# Patient Record
Sex: Female | Born: 1950 | Race: White | Hispanic: No | Smoking: Current every day smoker
Health system: Southern US, Community
[De-identification: ages and names within clinical notes are randomized; demographics above are authoritative.]

## PROBLEM LIST (undated history)

## (undated) DIAGNOSIS — R519 Headache, unspecified: Secondary | ICD-10-CM

## (undated) DIAGNOSIS — H269 Unspecified cataract: Secondary | ICD-10-CM

## (undated) DIAGNOSIS — M797 Fibromyalgia: Secondary | ICD-10-CM

## (undated) DIAGNOSIS — I10 Essential (primary) hypertension: Secondary | ICD-10-CM

## (undated) DIAGNOSIS — R51 Headache: Secondary | ICD-10-CM

## (undated) DIAGNOSIS — F329 Major depressive disorder, single episode, unspecified: Secondary | ICD-10-CM

## (undated) DIAGNOSIS — M4722 Other spondylosis with radiculopathy, cervical region: Secondary | ICD-10-CM

## (undated) DIAGNOSIS — F32A Depression, unspecified: Secondary | ICD-10-CM

## (undated) DIAGNOSIS — M4800 Spinal stenosis, site unspecified: Secondary | ICD-10-CM

## (undated) HISTORY — PX: APPENDECTOMY: SHX54

## (undated) HISTORY — PX: OTHER SURGICAL HISTORY: SHX169

## (undated) HISTORY — PX: EYE SURGERY: SHX253

---

## 1998-02-17 ENCOUNTER — Emergency Department (HOSPITAL_COMMUNITY): Admission: EM | Admit: 1998-02-17 | Discharge: 1998-02-17 | Payer: Self-pay | Admitting: Emergency Medicine

## 1999-07-11 ENCOUNTER — Other Ambulatory Visit: Admission: RE | Admit: 1999-07-11 | Discharge: 1999-07-11 | Payer: Self-pay | Admitting: Family Medicine

## 1999-12-01 ENCOUNTER — Emergency Department (HOSPITAL_COMMUNITY): Admission: EM | Admit: 1999-12-01 | Discharge: 1999-12-01 | Payer: Self-pay | Admitting: Emergency Medicine

## 2000-11-09 ENCOUNTER — Other Ambulatory Visit: Admission: RE | Admit: 2000-11-09 | Discharge: 2000-11-09 | Payer: Self-pay | Admitting: Obstetrics & Gynecology

## 2001-12-17 ENCOUNTER — Other Ambulatory Visit: Admission: RE | Admit: 2001-12-17 | Discharge: 2001-12-17 | Payer: Self-pay | Admitting: Obstetrics & Gynecology

## 2003-02-10 ENCOUNTER — Other Ambulatory Visit: Admission: RE | Admit: 2003-02-10 | Discharge: 2003-02-10 | Payer: Self-pay | Admitting: Family Medicine

## 2004-02-14 ENCOUNTER — Other Ambulatory Visit: Admission: RE | Admit: 2004-02-14 | Discharge: 2004-02-14 | Payer: Self-pay | Admitting: Obstetrics & Gynecology

## 2005-02-27 ENCOUNTER — Other Ambulatory Visit: Admission: RE | Admit: 2005-02-27 | Discharge: 2005-02-27 | Payer: Self-pay | Admitting: Obstetrics & Gynecology

## 2007-06-18 ENCOUNTER — Encounter: Admission: RE | Admit: 2007-06-18 | Discharge: 2007-06-18 | Payer: Self-pay | Admitting: Obstetrics & Gynecology

## 2013-01-07 ENCOUNTER — Other Ambulatory Visit: Payer: Self-pay | Admitting: Neurosurgery

## 2013-01-07 DIAGNOSIS — G959 Disease of spinal cord, unspecified: Secondary | ICD-10-CM

## 2013-01-08 ENCOUNTER — Ambulatory Visit
Admission: RE | Admit: 2013-01-08 | Discharge: 2013-01-08 | Disposition: A | Discharge status: Home / Self Care | Payer: Medicaid Other | Source: Ambulatory Visit | Attending: Neurosurgery | Admitting: Neurosurgery

## 2013-01-08 DIAGNOSIS — G959 Disease of spinal cord, unspecified: Secondary | ICD-10-CM

## 2014-09-05 ENCOUNTER — Other Ambulatory Visit: Payer: Self-pay | Admitting: Family Medicine

## 2014-09-05 DIAGNOSIS — Z720 Tobacco use: Secondary | ICD-10-CM

## 2014-09-26 ENCOUNTER — Other Ambulatory Visit: Payer: Self-pay | Admitting: Family Medicine

## 2014-09-26 DIAGNOSIS — Z720 Tobacco use: Secondary | ICD-10-CM

## 2014-09-27 ENCOUNTER — Inpatient Hospital Stay: Admission: RE | Admit: 2014-09-27 | Payer: Medicaid Other | Source: Ambulatory Visit

## 2014-10-04 ENCOUNTER — Inpatient Hospital Stay: Admission: RE | Admit: 2014-10-04 | Payer: Medicaid Other | Source: Ambulatory Visit

## 2016-02-21 ENCOUNTER — Other Ambulatory Visit: Payer: Self-pay | Admitting: Neurosurgery

## 2016-02-21 DIAGNOSIS — R29898 Other symptoms and signs involving the musculoskeletal system: Secondary | ICD-10-CM

## 2016-02-28 ENCOUNTER — Ambulatory Visit
Admission: RE | Admit: 2016-02-28 | Discharge: 2016-02-28 | Disposition: A | Discharge status: Home / Self Care | Payer: Medicare Other | Source: Ambulatory Visit | Attending: Neurosurgery | Admitting: Neurosurgery

## 2016-02-28 DIAGNOSIS — R29898 Other symptoms and signs involving the musculoskeletal system: Secondary | ICD-10-CM

## 2017-01-15 ENCOUNTER — Other Ambulatory Visit: Payer: Self-pay | Admitting: Neurosurgery

## 2017-01-29 ENCOUNTER — Other Ambulatory Visit: Payer: Self-pay

## 2017-01-29 ENCOUNTER — Encounter (HOSPITAL_COMMUNITY): Payer: Self-pay | Admitting: *Deleted

## 2017-01-29 ENCOUNTER — Encounter (HOSPITAL_COMMUNITY)
Admission: RE | Admit: 2017-01-29 | Discharge: 2017-01-29 | Disposition: A | Discharge status: Home / Self Care | Payer: Medicare Other | Source: Ambulatory Visit | Attending: Neurosurgery | Admitting: Neurosurgery

## 2017-01-29 ENCOUNTER — Ambulatory Visit (HOSPITAL_COMMUNITY)
Admission: RE | Admit: 2017-01-29 | Discharge: 2017-01-29 | Disposition: A | Discharge status: Home / Self Care | Payer: Medicare Other | Source: Ambulatory Visit | Attending: Anesthesiology | Admitting: Anesthesiology

## 2017-01-29 DIAGNOSIS — I700 Atherosclerosis of aorta: Secondary | ICD-10-CM | POA: Diagnosis not present

## 2017-01-29 DIAGNOSIS — Z01811 Encounter for preprocedural respiratory examination: Secondary | ICD-10-CM | POA: Diagnosis not present

## 2017-01-29 HISTORY — DX: Depression, unspecified: F32.A

## 2017-01-29 HISTORY — DX: Headache, unspecified: R51.9

## 2017-01-29 HISTORY — DX: Spinal stenosis, site unspecified: M48.00

## 2017-01-29 HISTORY — DX: Essential (primary) hypertension: I10

## 2017-01-29 HISTORY — DX: Fibromyalgia: M79.7

## 2017-01-29 HISTORY — DX: Headache: R51

## 2017-01-29 HISTORY — DX: Major depressive disorder, single episode, unspecified: F32.9

## 2017-01-29 HISTORY — DX: Unspecified cataract: H26.9

## 2017-01-29 LAB — BASIC METABOLIC PANEL
ANION GAP: 10 (ref 5–15)
BUN: 10 mg/dL (ref 6–20)
CHLORIDE: 101 mmol/L (ref 101–111)
CO2: 25 mmol/L (ref 22–32)
Calcium: 9.4 mg/dL (ref 8.9–10.3)
Creatinine, Ser: 0.59 mg/dL (ref 0.44–1.00)
Glucose, Bld: 101 mg/dL — ABNORMAL HIGH (ref 65–99)
POTASSIUM: 4.2 mmol/L (ref 3.5–5.1)
SODIUM: 136 mmol/L (ref 135–145)

## 2017-01-29 LAB — TYPE AND SCREEN
ABO/RH(D): A POS
Antibody Screen: NEGATIVE

## 2017-01-29 LAB — CBC
HCT: 46.6 % — ABNORMAL HIGH (ref 36.0–46.0)
HEMOGLOBIN: 15.8 g/dL — AB (ref 12.0–15.0)
MCH: 31.5 pg (ref 26.0–34.0)
MCHC: 33.9 g/dL (ref 30.0–36.0)
MCV: 93 fL (ref 78.0–100.0)
PLATELETS: 298 10*3/uL (ref 150–400)
RBC: 5.01 MIL/uL (ref 3.87–5.11)
RDW: 15.3 % (ref 11.5–15.5)
WBC: 9 10*3/uL (ref 4.0–10.5)

## 2017-01-29 LAB — HEMOGLOBIN A1C
HEMOGLOBIN A1C: 6.3 % — AB (ref 4.8–5.6)
MEAN PLASMA GLUCOSE: 134.11 mg/dL

## 2017-01-29 LAB — SURGICAL PCR SCREEN
MRSA, PCR: NEGATIVE
Staphylococcus aureus: NEGATIVE

## 2017-01-29 LAB — ABO/RH: ABO/RH(D): A POS

## 2017-01-29 NOTE — Progress Notes (Signed)
Denies any cardiac issues, no murmur, cp, sob PCP is Dr. Hart CarwinW Elkins  LOV 12/2016 She did say, later in the interview, that it was mentioned that she 'was pre-diabetic' yrs ago.  Has since lost weight.  Takes no meds. 'behaving myself" I did instruct patient to lay off the marijuana for 2-3 days prior to surgery.  She states it does help with her fibro & pain.

## 2017-01-29 NOTE — Pre-Procedure Instructions (Signed)
Sherri Stewart  01/29/2017      [facility] - [place], [place] - [address] [place] [id number] Phone: [phone] Fax: [phone]    Your procedure is scheduled on Wednesday, October 24th   [facility] at 11:00 AM            (posted surgery time 12:55p - 4:41p)   Call this number if you have problems the MORNING of surgery:  [phone].   Remember:              4-5 days prior to surgery, STOP TAKING any Vitamins, Herbal Supplements, Anti-inflammatories, Blood thinners.   Do not eat food or drink liquids after midnight Tuesday.   Take these medicines the morning of surgery with A SIP OF WATER : Xanax, Cymbalta   Do not wear jewelry, make-up or nail polish.  Do not wear lotions, powders, perfumes, or deoderant.  Do not shave 48 hours prior to surgery.     Do not bring valuables to the hospital.  [facility] is not responsible for any belongings or valuables.  Contacts, dentures or bridgework may not be worn into surgery.  Leave your suitcase in the car.  After surgery it may be brought to your room.  For patients admitted to the hospital, discharge time will be determined by your treatment team.  Please read over the following fact sheets that you were given. Pain Booklet, MRSA Information and Surgical Site Infection Prevention      [place]- Preparing For Surgery  Before surgery, you can play an important role. Because skin is not sterile, your skin needs to be as free of germs as possible. You can reduce the number of germs on your skin by washing with CHG (chlorahexidine gluconate) Soap before surgery.  CHG is an antiseptic cleaner which kills germs and bonds with the skin to continue killing germs even after washing.  Please do not use if you have an allergy to CHG or antibacterial soaps. If your skin becomes reddened/irritated stop using the CHG.  Do not shave (including  legs and underarms) for at least 48 hours prior to first CHG shower. It is OK to shave your face.  Please follow these instructions carefully.   1. Shower the NIGHT BEFORE SURGERY and the MORNING OF SURGERY with CHG.   2. If you chose to wash your hair, wash your hair first as usual with your normal shampoo.  3. After you shampoo, rinse your hair and body thoroughly to remove the shampoo.  4. Use CHG as you would any other liquid soap. You can apply CHG directly to the skin and wash gently with a scrungie or a clean washcloth.   5. Apply the CHG Soap to your body ONLY FROM THE NECK DOWN.  Do not use on open wounds or open sores. Avoid contact with your eyes, ears, mouth and genitals (private parts). Wash Face and genitals (private parts)  with your normal soap.  6. Wash thoroughly, paying special attention to the area where your surgery will be performed.  7. Thoroughly rinse your body with warm water from the neck down.  8. DO NOT shower/wash with your normal soap after using and rinsing off the CHG Soap.  9. Pat yourself dry with a CLEAN TOWEL.  10. Wear CLEAN PAJAMAS to bed the night before surgery, wear comfortable clothes the morning of surgery  11. Place CLEAN SHEETS on your bed the  night of your first shower and DO NOT SLEEP WITH PETS.    Day of Surgery: Do not apply any deodorants/lotions. Please wear clean clothes to the hospital/surgery center.

## 2017-01-30 NOTE — Progress Notes (Addendum)
Anesthesia Chart Review: Patient's is a 66 year old female scheduled for C4-5, C5-6, C6-7 ACDF on 02/04/17 by Dr. Tressie StalkerJeffrey Jenkins.  History includes smoking, HTN, depression, fibromylagia, appendectomy. Reportedly she was "pre-diabetic" years ago, but lost weight since. BMI is consistent with obesity.   PCP is listed as Dr. Kaleen MaskWilson Oliver Elkins, reported last office visit in September 2018.    Meds include Xanax, vitamin B12, Cymbalta, HCTZ, lisinopril, prednisone 20 mg every other day. Occasional marijuana (she uses for fibromyalgia pain).  BP 123/71   Pulse 83   Temp 36.7 C   Resp 20   Ht 5' 0.5" (1.537 m)   Wt 198 lb 1.6 oz (89.9 kg)   SpO2 97%   BMI 38.05 kg/m   EKG 01/29/17: NSR, low voltage QRS, inferior infarct (age undetermined). Currently no comparison tracing available. She denied history of cardiac issues, murmur, chest pain, and SOB.   CXR 01/29/17: IMPRESSION: No active cardiopulmonary disease.  Aortic atherosclerosis.  Preoperative labs noted. Cr 0.59, glucose 101. H/H 15.8/46.6. PLT 298K. A1c 6.3%. T&S done.   I have requested last office not and comparison EKG tracing, if available, from Dr. Hennie DuosElkin's office. Chart will be left for follow-up.  Velna Ochsllison Jonluke Cobbins, PA-C [facility]/Anesthesiology Phone [phone] 01/30/2017 3:04 PM  Addendum: Still no records received from Dr. Hennie DuosElkin's office. I called and was told they do not have an EKG on file there. At PAT, patient denied any cardiac issues, murmur history, chest pain and SOB. Reviewed above with anesthesiologist. If no acute changes and remains asymptomatic from a CV standpoint then it is anticipated that she can proceed as planned.  Velna Ochsllison Kalen Neidert, PA-C [facility]Anesthesiology Phone [phone] 02/03/2017 11:40 AM

## 2017-02-04 ENCOUNTER — Encounter (HOSPITAL_COMMUNITY): Payer: Self-pay

## 2017-02-04 ENCOUNTER — Inpatient Hospital Stay (HOSPITAL_COMMUNITY): Payer: Medicare Other | Admitting: Anesthesiology

## 2017-02-04 ENCOUNTER — Inpatient Hospital Stay (HOSPITAL_COMMUNITY): Payer: Medicare Other

## 2017-02-04 ENCOUNTER — Inpatient Hospital Stay (HOSPITAL_COMMUNITY)
Admission: RE | Disposition: A | Discharge status: Home / Self Care | Payer: Self-pay | Source: Ambulatory Visit | Attending: Neurosurgery

## 2017-02-04 ENCOUNTER — Inpatient Hospital Stay (HOSPITAL_COMMUNITY)
Admission: RE | Admit: 2017-02-04 | Discharge: 2017-02-05 | DRG: 473 | Disposition: A | Discharge status: Home / Self Care | Payer: Medicare Other | Source: Ambulatory Visit | Attending: Neurosurgery | Admitting: Neurosurgery

## 2017-02-04 ENCOUNTER — Inpatient Hospital Stay (HOSPITAL_COMMUNITY): Payer: Medicare Other | Admitting: Vascular Surgery

## 2017-02-04 DIAGNOSIS — Z885 Allergy status to narcotic agent status: Secondary | ICD-10-CM | POA: Diagnosis not present

## 2017-02-04 DIAGNOSIS — Z79899 Other long term (current) drug therapy: Secondary | ICD-10-CM | POA: Diagnosis not present

## 2017-02-04 DIAGNOSIS — M50123 Cervical disc disorder at C6-C7 level with radiculopathy: Secondary | ICD-10-CM | POA: Diagnosis present

## 2017-02-04 DIAGNOSIS — F329 Major depressive disorder, single episode, unspecified: Secondary | ICD-10-CM | POA: Diagnosis present

## 2017-02-04 DIAGNOSIS — Z419 Encounter for procedure for purposes other than remedying health state, unspecified: Secondary | ICD-10-CM

## 2017-02-04 DIAGNOSIS — F17210 Nicotine dependence, cigarettes, uncomplicated: Secondary | ICD-10-CM | POA: Diagnosis present

## 2017-02-04 DIAGNOSIS — M4722 Other spondylosis with radiculopathy, cervical region: Secondary | ICD-10-CM | POA: Diagnosis present

## 2017-02-04 DIAGNOSIS — I10 Essential (primary) hypertension: Secondary | ICD-10-CM | POA: Diagnosis present

## 2017-02-04 DIAGNOSIS — M4802 Spinal stenosis, cervical region: Secondary | ICD-10-CM | POA: Diagnosis present

## 2017-02-04 HISTORY — DX: Other spondylosis with radiculopathy, cervical region: M47.22

## 2017-02-04 HISTORY — PX: ANTERIOR CERVICAL DECOMP/DISCECTOMY FUSION: SHX1161

## 2017-02-04 SURGERY — ANTERIOR CERVICAL DECOMPRESSION/DISCECTOMY FUSION 3 LEVELS
Anesthesia: General | Site: Neck

## 2017-02-04 MED ORDER — ONDANSETRON HCL 4 MG/2ML IJ SOLN: 4.0000 mg | Freq: Four times a day (QID) | INTRAMUSCULAR | Status: DC | PRN

## 2017-02-04 MED ORDER — HYDROCORTISONE NA SUCCINATE PF 100 MG IJ SOLR: 50.0000 mg | Freq: Four times a day (QID) | INTRAMUSCULAR | Status: DC

## 2017-02-04 MED ORDER — OXYCODONE HCL 5 MG PO TABS
5.0000 mg | ORAL_TABLET | ORAL | Status: DC | PRN
Start: 2017-02-04 — End: 2017-02-05

## 2017-02-04 MED ORDER — DEXAMETHASONE SODIUM PHOSPHATE 4 MG/ML IJ SOLN: 4.0000 mg | Freq: Four times a day (QID) | INTRAMUSCULAR | Status: DC

## 2017-02-04 MED ORDER — LISINOPRIL 20 MG PO TABS
10.0000 mg | ORAL_TABLET | Freq: Every day | ORAL | Status: DC
  Administered 2017-02-04: 10 mg via ORAL
  Filled 2017-02-04: qty 1

## 2017-02-04 MED ORDER — FENTANYL CITRATE (PF) 250 MCG/5ML IJ SOLN
INTRAMUSCULAR | Status: AC
  Filled 2017-02-04: qty 5

## 2017-02-04 MED ORDER — DEXAMETHASONE SODIUM PHOSPHATE 10 MG/ML IJ SOLN
INTRAMUSCULAR | Status: DC | PRN
  Administered 2017-02-04: 10 mg via INTRAVENOUS

## 2017-02-04 MED ORDER — ALUM & MAG HYDROXIDE-SIMETH 200-200-20 MG/5ML PO SUSP
30.0000 mL | Freq: Four times a day (QID) | ORAL | Status: DC | PRN
Start: 2017-02-04 — End: 2017-02-04

## 2017-02-04 MED ORDER — DEXAMETHASONE SODIUM PHOSPHATE 10 MG/ML IJ SOLN
INTRAMUSCULAR | Status: AC
  Filled 2017-02-04: qty 1

## 2017-02-04 MED ORDER — CHLORHEXIDINE GLUCONATE CLOTH 2 % EX PADS: 6.0000 | MEDICATED_PAD | Freq: Once | CUTANEOUS | Status: DC

## 2017-02-04 MED ORDER — PROPOFOL 10 MG/ML IV BOLUS
INTRAVENOUS | Status: AC
  Filled 2017-02-04: qty 40

## 2017-02-04 MED ORDER — OXYCODONE HCL 5 MG PO TABS: 5.0000 mg | ORAL_TABLET | Freq: Once | ORAL | Status: DC | PRN

## 2017-02-04 MED ORDER — ACETAMINOPHEN 650 MG RE SUPP: 650.0000 mg | RECTAL | Status: DC | PRN

## 2017-02-04 MED ORDER — HEMOSTATIC AGENTS (NO CHARGE) OPTIME
TOPICAL | Status: DC | PRN
  Administered 2017-02-04: 1 via TOPICAL

## 2017-02-04 MED ORDER — OXYCODONE HCL 5 MG PO TABS: 5.0000 mg | ORAL_TABLET | ORAL | Status: DC | PRN

## 2017-02-04 MED ORDER — LIDOCAINE 2% (20 MG/ML) 5 ML SYRINGE
INTRAMUSCULAR | Status: AC
  Filled 2017-02-04: qty 5

## 2017-02-04 MED ORDER — LACTATED RINGERS IV SOLN
INTRAVENOUS | Status: DC
Start: 2017-02-04 — End: 2017-02-05

## 2017-02-04 MED ORDER — BUPIVACAINE-EPINEPHRINE (PF) 0.5% -1:200000 IJ SOLN
INTRAMUSCULAR | Status: DC | PRN
  Administered 2017-02-04: 10 mL

## 2017-02-04 MED ORDER — ALUM & MAG HYDROXIDE-SIMETH 200-200-20 MG/5ML PO SUSP: 30.0000 mL | Freq: Four times a day (QID) | ORAL | Status: DC | PRN

## 2017-02-04 MED ORDER — PREDNISONE 20 MG PO TABS
20.0000 mg | ORAL_TABLET | ORAL | Status: DC
Start: 2017-02-05 — End: 2017-02-05

## 2017-02-04 MED ORDER — ONDANSETRON HCL 4 MG/2ML IJ SOLN
4.0000 mg | Freq: Four times a day (QID) | INTRAMUSCULAR | Status: DC | PRN
Start: 2017-02-04 — End: 2017-02-04

## 2017-02-04 MED ORDER — MIDAZOLAM HCL 5 MG/5ML IJ SOLN
INTRAMUSCULAR | Status: DC | PRN
  Administered 2017-02-04 (×2): 1 mg via INTRAVENOUS

## 2017-02-04 MED ORDER — HYDROMORPHONE HCL 1 MG/ML IJ SOLN
INTRAMUSCULAR | Status: AC
  Administered 2017-02-04: 0.5 mg via INTRAVENOUS
  Filled 2017-02-04: qty 1

## 2017-02-04 MED ORDER — THROMBIN (RECOMBINANT) 5000 UNITS EX SOLR
CUTANEOUS | Status: AC
  Filled 2017-02-04: qty 5000

## 2017-02-04 MED ORDER — HYDROCHLOROTHIAZIDE 25 MG PO TABS
25.0000 mg | ORAL_TABLET | Freq: Every day | ORAL | Status: DC
  Administered 2017-02-04: 25 mg via ORAL
  Filled 2017-02-04: qty 1

## 2017-02-04 MED ORDER — LACTATED RINGERS IV SOLN: INTRAVENOUS | Status: DC

## 2017-02-04 MED ORDER — BISACODYL 10 MG RE SUPP: 10.0000 mg | Freq: Every day | RECTAL | Status: DC | PRN

## 2017-02-04 MED ORDER — ONDANSETRON HCL 4 MG PO TABS: 4.0000 mg | ORAL_TABLET | Freq: Four times a day (QID) | ORAL | Status: DC | PRN

## 2017-02-04 MED ORDER — DEXAMETHASONE 4 MG PO TABS: 4.0000 mg | ORAL_TABLET | Freq: Four times a day (QID) | ORAL | Status: DC

## 2017-02-04 MED ORDER — LIDOCAINE HCL (CARDIAC) 20 MG/ML IV SOLN
INTRAVENOUS | Status: DC | PRN
  Administered 2017-02-04: 100 mg via INTRAVENOUS

## 2017-02-04 MED ORDER — ONDANSETRON HCL 4 MG/2ML IJ SOLN
INTRAMUSCULAR | Status: DC | PRN
  Administered 2017-02-04: 4 mg via INTRAVENOUS

## 2017-02-04 MED ORDER — BACITRACIN ZINC 500 UNIT/GM EX OINT
TOPICAL_OINTMENT | CUTANEOUS | Status: AC
  Filled 2017-02-04: qty 28.35

## 2017-02-04 MED ORDER — PROPOFOL 10 MG/ML IV BOLUS
INTRAVENOUS | Status: DC | PRN
  Administered 2017-02-04: 160 mg via INTRAVENOUS
  Administered 2017-02-04: 40 mg via INTRAVENOUS

## 2017-02-04 MED ORDER — HYDROMORPHONE HCL 1 MG/ML IJ SOLN
0.2500 mg | INTRAMUSCULAR | Status: DC | PRN
  Administered 2017-02-04 (×2): 0.5 mg via INTRAVENOUS

## 2017-02-04 MED ORDER — DOCUSATE SODIUM 100 MG PO CAPS: 100.0000 mg | ORAL_CAPSULE | Freq: Two times a day (BID) | ORAL | Status: DC

## 2017-02-04 MED ORDER — OXYCODONE HCL 5 MG PO TABS: 10.0000 mg | ORAL_TABLET | ORAL | Status: DC | PRN

## 2017-02-04 MED ORDER — CEFAZOLIN SODIUM-DEXTROSE 2-4 GM/100ML-% IV SOLN
2.0000 g | INTRAVENOUS | Status: AC
  Administered 2017-02-04: 2 g via INTRAVENOUS
  Filled 2017-02-04: qty 100

## 2017-02-04 MED ORDER — ZOLPIDEM TARTRATE 5 MG PO TABS: 5.0000 mg | ORAL_TABLET | Freq: Every evening | ORAL | Status: DC | PRN

## 2017-02-04 MED ORDER — PHENYLEPHRINE HCL 10 MG/ML IJ SOLN
INTRAMUSCULAR | Status: DC | PRN
  Administered 2017-02-04: 80 ug via INTRAVENOUS
  Administered 2017-02-04 (×2): 40 ug via INTRAVENOUS

## 2017-02-04 MED ORDER — THROMBIN (RECOMBINANT) 20000 UNITS EX SOLR
CUTANEOUS | Status: AC
  Filled 2017-02-04: qty 20000

## 2017-02-04 MED ORDER — ROCURONIUM BROMIDE 10 MG/ML (PF) SYRINGE
PREFILLED_SYRINGE | INTRAVENOUS | Status: AC
  Filled 2017-02-04: qty 5

## 2017-02-04 MED ORDER — PHENOL 1.4 % MT LIQD: 1.0000 | OROMUCOSAL | Status: DC | PRN

## 2017-02-04 MED ORDER — CEFAZOLIN SODIUM-DEXTROSE 2-4 GM/100ML-% IV SOLN
2.0000 g | Freq: Three times a day (TID) | INTRAVENOUS | Status: AC
  Administered 2017-02-04 (×2): 2 g via INTRAVENOUS
  Filled 2017-02-04 (×2): qty 100

## 2017-02-04 MED ORDER — ONDANSETRON HCL 4 MG/2ML IJ SOLN
INTRAMUSCULAR | Status: AC
  Filled 2017-02-04: qty 2

## 2017-02-04 MED ORDER — ACETAMINOPHEN 325 MG PO TABS: 650.0000 mg | ORAL_TABLET | ORAL | Status: DC | PRN

## 2017-02-04 MED ORDER — FENTANYL CITRATE (PF) 100 MCG/2ML IJ SOLN
INTRAMUSCULAR | Status: DC | PRN
  Administered 2017-02-04 (×8): 50 ug via INTRAVENOUS
  Administered 2017-02-04: 100 ug via INTRAVENOUS

## 2017-02-04 MED ORDER — CEFAZOLIN SODIUM-DEXTROSE 2-4 GM/100ML-% IV SOLN: 2.0000 g | Freq: Three times a day (TID) | INTRAVENOUS | Status: DC

## 2017-02-04 MED ORDER — SUCCINYLCHOLINE CHLORIDE 20 MG/ML IJ SOLN
INTRAMUSCULAR | Status: DC | PRN
  Administered 2017-02-04: 120 mg via INTRAVENOUS

## 2017-02-04 MED ORDER — DULOXETINE HCL 30 MG PO CPEP: 120.0000 mg | ORAL_CAPSULE | Freq: Every day | ORAL | Status: DC

## 2017-02-04 MED ORDER — SODIUM CHLORIDE 0.9 % IR SOLN
Status: DC | PRN
  Administered 2017-02-04: 500 mL

## 2017-02-04 MED ORDER — DEXAMETHASONE 4 MG PO TABS
4.0000 mg | ORAL_TABLET | Freq: Four times a day (QID) | ORAL | Status: AC
  Administered 2017-02-04 – 2017-02-05 (×3): 4 mg via ORAL
  Filled 2017-02-04 (×3): qty 1

## 2017-02-04 MED ORDER — MENTHOL 3 MG MT LOZG: 1.0000 | LOZENGE | OROMUCOSAL | Status: DC | PRN

## 2017-02-04 MED ORDER — OXYCODONE HCL 5 MG/5ML PO SOLN: 5.0000 mg | Freq: Once | ORAL | Status: DC | PRN

## 2017-02-04 MED ORDER — THROMBIN (RECOMBINANT) 5000 UNITS EX SOLR
OROMUCOSAL | Status: DC | PRN
  Administered 2017-02-04 (×2): 5 mL via TOPICAL

## 2017-02-04 MED ORDER — DEXAMETHASONE SODIUM PHOSPHATE 4 MG/ML IJ SOLN
4.0000 mg | Freq: Four times a day (QID) | INTRAMUSCULAR | Status: AC
  Administered 2017-02-04: 4 mg via INTRAVENOUS
  Filled 2017-02-04: qty 1

## 2017-02-04 MED ORDER — MORPHINE SULFATE (PF) 4 MG/ML IV SOLN: 4.0000 mg | INTRAVENOUS | Status: DC | PRN

## 2017-02-04 MED ORDER — SUGAMMADEX SODIUM 200 MG/2ML IV SOLN
INTRAVENOUS | Status: DC | PRN
  Administered 2017-02-04: 200 mg via INTRAVENOUS

## 2017-02-04 MED ORDER — LACTATED RINGERS IV SOLN
INTRAVENOUS | Status: DC | PRN
  Administered 2017-02-04 (×2): via INTRAVENOUS

## 2017-02-04 MED ORDER — SUCCINYLCHOLINE CHLORIDE 200 MG/10ML IV SOSY
PREFILLED_SYRINGE | INTRAVENOUS | Status: AC
  Filled 2017-02-04: qty 10

## 2017-02-04 MED ORDER — MEPERIDINE HCL 25 MG/ML IJ SOLN: 6.2500 mg | INTRAMUSCULAR | Status: DC | PRN

## 2017-02-04 MED ORDER — THROMBIN (RECOMBINANT) 20000 UNITS EX SOLR
CUTANEOUS | Status: DC | PRN
  Administered 2017-02-04: 20000 [IU] via TOPICAL

## 2017-02-04 MED ORDER — BACITRACIN ZINC 500 UNIT/GM EX OINT
TOPICAL_OINTMENT | CUTANEOUS | Status: DC | PRN
  Administered 2017-02-04: 1 via TOPICAL

## 2017-02-04 MED ORDER — OXYCODONE HCL 5 MG PO TABS
10.0000 mg | ORAL_TABLET | ORAL | Status: DC | PRN
Start: 2017-02-04 — End: 2017-02-05
  Administered 2017-02-04 – 2017-02-05 (×5): 10 mg via ORAL
  Filled 2017-02-04 (×5): qty 2

## 2017-02-04 MED ORDER — PROMETHAZINE HCL 25 MG/ML IJ SOLN: 6.2500 mg | INTRAMUSCULAR | Status: DC | PRN

## 2017-02-04 MED ORDER — ROCURONIUM BROMIDE 100 MG/10ML IV SOLN
INTRAVENOUS | Status: DC | PRN
  Administered 2017-02-04 (×2): 10 mg via INTRAVENOUS
  Administered 2017-02-04: 50 mg via INTRAVENOUS
  Administered 2017-02-04 (×2): 10 mg via INTRAVENOUS

## 2017-02-04 MED ORDER — BUPIVACAINE HCL (PF) 0.5 % IJ SOLN
INTRAMUSCULAR | Status: AC
  Filled 2017-02-04: qty 30

## 2017-02-04 MED ORDER — MIDAZOLAM HCL 2 MG/2ML IJ SOLN
INTRAMUSCULAR | Status: AC
  Filled 2017-02-04: qty 2

## 2017-02-04 SURGICAL SUPPLY — 68 items
APL SKNCLS STERI-STRIP NONHPOA (GAUZE/BANDAGES/DRESSINGS) ×1
BAG DECANTER FOR FLEXI CONT (MISCELLANEOUS) ×3 IMPLANT
BENZOIN TINCTURE PRP APPL 2/3 (GAUZE/BANDAGES/DRESSINGS) ×4 IMPLANT
BIT DRILL NEURO 2X3.1 SFT TUCH (MISCELLANEOUS) ×1 IMPLANT
BLADE SURG 15 STRL LF DISP TIS (BLADE) ×1 IMPLANT
BLADE SURG 15 STRL SS (BLADE) ×6
BLADE ULTRA TIP 2M (BLADE) ×3 IMPLANT
BUR BARREL STRAIGHT FLUTE 4.0 (BURR) ×3 IMPLANT
BUR MATCHSTICK NEURO 3.0 LAGG (BURR) ×3 IMPLANT
CAGE PEEK VISTAS 11X14X6 (Cage) ×2 IMPLANT
CANISTER SUCT 3000ML PPV (MISCELLANEOUS) ×3 IMPLANT
CARTRIDGE OIL MAESTRO DRILL (MISCELLANEOUS) ×1 IMPLANT
CLOSURE WOUND 1/2 X4 (GAUZE/BANDAGES/DRESSINGS) ×1
COVER MAYO STAND STRL (DRAPES) ×5 IMPLANT
DIFFUSER DRILL AIR PNEUMATIC (MISCELLANEOUS) ×3 IMPLANT
DRAIN JACKSON PRATT 10MM FLAT (MISCELLANEOUS) ×2 IMPLANT
DRAPE LAPAROTOMY 100X72 PEDS (DRAPES) ×3 IMPLANT
DRAPE MICROSCOPE LEICA (MISCELLANEOUS) IMPLANT
DRAPE POUCH INSTRU U-SHP 10X18 (DRAPES) ×3 IMPLANT
DRAPE SURG 17X23 STRL (DRAPES) ×6 IMPLANT
DRILL NEURO 2X3.1 SOFT TOUCH (MISCELLANEOUS) ×3
ELECT REM PT RETURN 9FT ADLT (ELECTROSURGICAL) ×3
ELECTRODE REM PT RTRN 9FT ADLT (ELECTROSURGICAL) ×1 IMPLANT
EVACUATOR SILICONE 100CC (DRAIN) ×2 IMPLANT
GAUZE SPONGE 4X4 12PLY STRL (GAUZE/BANDAGES/DRESSINGS) ×3 IMPLANT
GAUZE SPONGE 4X4 12PLY STRL LF (GAUZE/BANDAGES/DRESSINGS) ×2 IMPLANT
GAUZE SPONGE 4X4 16PLY XRAY LF (GAUZE/BANDAGES/DRESSINGS) ×2 IMPLANT
GLOVE BIO SURGEON STRL SZ8 (GLOVE) ×3 IMPLANT
GLOVE BIO SURGEON STRL SZ8.5 (GLOVE) ×3 IMPLANT
GLOVE BIOGEL PI IND STRL 6.5 (GLOVE) IMPLANT
GLOVE BIOGEL PI INDICATOR 6.5 (GLOVE) ×2
GLOVE ECLIPSE 7.5 STRL STRAW (GLOVE) ×2 IMPLANT
GLOVE EXAM NITRILE LRG STRL (GLOVE) IMPLANT
GLOVE EXAM NITRILE XL STR (GLOVE) IMPLANT
GLOVE EXAM NITRILE XS STR PU (GLOVE) IMPLANT
GLOVE INDICATOR 8.0 STRL GRN (GLOVE) ×2 IMPLANT
GLOVE SURG SS PI 6.5 STRL IVOR (GLOVE) ×2 IMPLANT
GOWN STRL REUS W/ TWL LRG LVL3 (GOWN DISPOSABLE) IMPLANT
GOWN STRL REUS W/ TWL XL LVL3 (GOWN DISPOSABLE) IMPLANT
GOWN STRL REUS W/TWL LRG LVL3 (GOWN DISPOSABLE) ×6
GOWN STRL REUS W/TWL XL LVL3 (GOWN DISPOSABLE) ×6
HEMOSTAT POWDER KIT SURGIFOAM (HEMOSTASIS) ×5 IMPLANT
KIT BASIN OR (CUSTOM PROCEDURE TRAY) ×3 IMPLANT
KIT ROOM TURNOVER OR (KITS) ×3 IMPLANT
MARKER SKIN DUAL TIP RULER LAB (MISCELLANEOUS) ×3 IMPLANT
NDL SPNL 18GX3.5 QUINCKE PK (NEEDLE) ×1 IMPLANT
NEEDLE HYPO 22GX1.5 SAFETY (NEEDLE) ×3 IMPLANT
NEEDLE SPNL 18GX3.5 QUINCKE PK (NEEDLE) ×3 IMPLANT
NS IRRIG 1000ML POUR BTL (IV SOLUTION) ×3 IMPLANT
OIL CARTRIDGE MAESTRO DRILL (MISCELLANEOUS) ×3
PACK LAMINECTOMY NEURO (CUSTOM PROCEDURE TRAY) ×3 IMPLANT
PATTIES SURGICAL 1X1 (DISPOSABLE) ×2 IMPLANT
PEEK S VISTA 7X11X14 (Peek) ×4 IMPLANT
PIN DISTRACTION 14MM (PIN) ×6 IMPLANT
PLATE ANT CERV XTEND 3 LV 39 (Plate) ×2 IMPLANT
PUTTY KINEX BIOACTIVE 5CC (Bone Implant) ×2 IMPLANT
RUBBERBAND STERILE (MISCELLANEOUS) IMPLANT
SCREW XTD VAR 4.2 SELF TAP 12 (Screw) ×16 IMPLANT
SPONGE INTESTINAL PEANUT (DISPOSABLE) ×6 IMPLANT
SPONGE SURGIFOAM ABS GEL 100 (HEMOSTASIS) ×3 IMPLANT
STRIP CLOSURE SKIN 1/2X4 (GAUZE/BANDAGES/DRESSINGS) ×2 IMPLANT
SUT VIC AB 0 CT1 27 (SUTURE) ×3
SUT VIC AB 0 CT1 27XBRD ANTBC (SUTURE) ×1 IMPLANT
SUT VIC AB 3-0 SH 8-18 (SUTURE) ×3 IMPLANT
TAPE CLOTH SURG 4X10 WHT LF (GAUZE/BANDAGES/DRESSINGS) ×2 IMPLANT
TOWEL GREEN STERILE (TOWEL DISPOSABLE) ×3 IMPLANT
TOWEL GREEN STERILE FF (TOWEL DISPOSABLE) ×3 IMPLANT
WATER STERILE IRR 1000ML POUR (IV SOLUTION) ×3 IMPLANT

## 2017-02-04 NOTE — Anesthesia Procedure Notes (Signed)
Procedure Name: Intubation Date/Time: 02/04/2017 7:48 AM Performed by: Fransisca KaufmannMEYER, Demaree Liberto E Pre-anesthesia Checklist: Patient identified, Emergency Drugs available, Suction available and Patient being monitored Patient Re-evaluated:Patient Re-evaluated prior to induction Oxygen Delivery Method: Circle System Utilized Preoxygenation: Pre-oxygenation with 100% oxygen Induction Type: IV induction Ventilation: Mask ventilation without difficulty Laryngoscope Size: Miller and 2 Grade View: Grade I Tube type: Oral Tube size: 7.0 mm Number of attempts: 1 Airway Equipment and Method: Stylet and Oral airway Placement Confirmation: ETT inserted through vocal cords under direct vision,  positive ETCO2 and breath sounds checked- equal and bilateral Secured at: 21 cm Tube secured with: Tape Dental Injury: Teeth and Oropharynx as per pre-operative assessment

## 2017-02-04 NOTE — Transfer of Care (Signed)
Immediate Anesthesia Transfer of Care Note  Patient: Sherri ChafeSusan Stewart  Procedure(s) Performed: ANTERIOR CERVICAL DECOMPRESSION/DISCECTOMY Aretha ParrotFUSION,INTERBODY PROSTHESIS, PLATE/SCREWS CEREVICAL FOUR - CERVICAL FIVE , CERVICAL FIVE - CERVICAL SIX , CERVICAL SIX , CERVICAL SEVEN (N/A Neck)  Patient Location: PACU  Anesthesia Type:General  Level of Consciousness: awake, alert , oriented and sedated  Airway & Oxygen Therapy: Patient Spontanous Breathing and Patient connected to nasal cannula oxygen  Post-op Assessment: Report given to RN, Post -op Vital signs reviewed and stable and Patient moving all extremities  Post vital signs: Reviewed and stable  Last Vitals:  Vitals:   02/04/17 0544 02/04/17 1145  BP: (!) 144/79   Pulse: 87   Resp: 18   Temp: 36.6 C (P) 37.1 C  SpO2: 96%     Last Pain:  Vitals:   02/04/17 1145  TempSrc:   PainSc: (P) Asleep      Patients Stated Pain Goal: 3 (02/04/17 0622)  Complications: No apparent anesthesia complications

## 2017-02-04 NOTE — Anesthesia Postprocedure Evaluation (Signed)
Anesthesia Post Note  Patient: Arva ChafeSusan Biddinger  Procedure(s) Performed: ANTERIOR CERVICAL DECOMPRESSION/DISCECTOMY FUSION,INTERBODY PROSTHESIS, PLATE/SCREWS CEREVICAL FOUR - CERVICAL FIVE , CERVICAL FIVE - CERVICAL SIX , CERVICAL SIX , CERVICAL SEVEN (N/A Neck)     Patient location during evaluation: PACU Anesthesia Type: General Level of consciousness: awake and alert Pain management: pain level controlled Vital Signs Assessment: post-procedure vital signs reviewed and stable Respiratory status: spontaneous breathing, nonlabored ventilation and respiratory function stable Cardiovascular status: blood pressure returned to baseline and stable Postop Assessment: no apparent nausea or vomiting Anesthetic complications: no    Last Vitals:  Vitals:   02/04/17 1259 02/04/17 1314  BP: 119/72 127/83  Pulse: 93 96  Resp: 12 (!) 25  Temp:    SpO2: 97% 98%    Last Pain:  Vitals:   02/04/17 1315  TempSrc:   PainSc: Asleep                 Lowella CurbWarren Ray Treylan Mcclintock

## 2017-02-04 NOTE — Op Note (Signed)
Brief history: The patient is a 66 year old white female who has complained of neck and arm pain consistent with a cervical radiculopathy. She has failed medical management and was worked up with a cervical MRI. The MRI demonstrated spondylosis and foraminal stenosis at C4-5, C5-6 and C6-7. I discussed the various treatment options with the patient including surgery. She has weighed the risks, benefits, and alternatives to surgery and decided proceed with the C4-5, C5-6 and C6-7 anterior cervical discectomy, fusion and plating.  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 Delma Officer  Asst.: Dr. Newell Coral  Anesthesia: Gen. endotracheal  Estimated blood loss: 150 mL  Drains: One prevertebral Jackson-Pratt drain  Complications: None  Description of procedure: The patient was brought to the operating room by the anesthesia team. General endotracheal anesthesia was induced. A roll was placed under the patient's shoulders to keep the neck in the neutral position. The patient's anterior cervical region was then prepared with Betadine scrub and Betadine solution. Sterile drapes were applied.  The area to be incised was then injected with Marcaine with epinephrine solution. I then used a scalpel to make a transverse incision in the patient's left anterior neck. I used the Metzenbaum scissors to divide the platysmal muscle and then to dissect medial to the sternocleidomastoid muscle, jugular vein, and carotid artery. I carefully dissected  down towards the anterior cervical spine identifying the esophagus and retracting it medially. Then using Kitner swabs to clear soft tissue from the anterior cervical spine. We then inserted a bent spinal needle into the upper exposed intervertebral disc space. We then obtained intraoperative radiographs confirm our location.  I then used electrocautery to detach the medial border of the longus colli muscle bilaterally from the C4-5, C5-6 and C6-7 intervertebral disc spaces. I then inserted the Caspar self-retaining retractor underneath the longus colli muscle bilaterally to provide exposure.  We then incised the intervertebral disc at C4-5. We then performed a partial intervertebral discectomy with a pituitary forceps and the Karlin curettes. I then inserted distraction screws into the vertebral bodies at C4-5. We then distracted the interspace. We then used the high-speed drill to decorticate the vertebral endplates at C4-5, to drill away the remainder of the intervertebral disc, to drill away some posterior spondylosis, and to thin out the posterior longitudinal ligament. I then incised ligament with the arachnoid knife. We then removed the ligament with a Kerrison punches undercutting the vertebral endplates and decompressing the thecal sac. We then performed foraminotomies about the bilateral C5 nerve roots. This completed the decompression at this level.  We then repeated this procedure in analogous fashion at C5-6 and C6-7 decompressing the thecal sac and the bilateral C6 and C7 nerve roots.  We now turned our to attention to the interbody fusion. We used the trial spacers to determine the appropriate size for the interbody prosthesis. We then pre-filled prosthesis with a combination of local morcellized autograft bone that we obtained during decompression as well as Kinnex bone graft extender. We then inserted the prosthesis into the distracted interspace at C4-5, C5-6 and C6-7. We then removed the  distraction screws. There was a good snug fit of  the prosthesis in the interspace.   Having completed the fusion we now turned attention to the anterior spinal instrumentation. We used the high-speed drill to drill away some anterior spondylosis at the disc spaces so that the plate lay down flat. We selected the appropriate length titanium anterior cervical plate. We laid it along the anterior aspect of the vertebral bodies from C4-C7. We then drilled 12 mm holes at C4, C5, C6 and C7. We then secured the plate to the vertebral bodies by placing two 12 mm self-tapping screws at C4, C5, C6 and C7. We then obtained intraoperative radiograph. The demonstrating good position of the instrumentation. We therefore secured the screws the plate the locking each cam. This completed the instrumentation.  We then obtained hemostasis using bipolar electrocautery. We irrigated the wound out with bacitracin solution. We then removed the retractor. We inspected the esophagus for any damage. There was none apparent. I placed a 10 mm flat Jackson-Pratt drain in the prevertebral space and tunneled it out through a separate stab wound. We then reapproximated patient's platysmal muscle with interrupted 3-0 Vicryl suture. We then reapproximated the subcutaneous tissue with interrupted 3-0 Vicryl suture. The skin was reapproximated with Steri-Strips and benzoin. The wound was then covered with bacitracin ointment. A sterile dressing was applied. The drapes were removed. Patient was subsequently extubated by the anesthesia team and transported to the post anesthesia care unit in stable condition. All sponge instrument and needle counts were reportedly correct at the end of this case.

## 2017-02-04 NOTE — Anesthesia Preprocedure Evaluation (Signed)
Anesthesia Evaluation  Patient identified by MRN, date of birth, ID band Patient awake    Reviewed: Allergy & Precautions, NPO status , Patient's Chart, lab work & pertinent test results  Airway Mallampati: II  TM Distance: >3 FB Neck ROM: Full    Dental no notable dental hx.    Pulmonary neg pulmonary ROS, Current Smoker,    Pulmonary exam normal breath sounds clear to auscultation       Cardiovascular hypertension, Pt. on medications negative cardio ROS Normal cardiovascular exam Rhythm:Regular Rate:Normal     Neuro/Psych  Headaches, Depression  Neuromuscular disease negative neurological ROS  negative psych ROS   GI/Hepatic negative GI ROS, Neg liver ROS,   Endo/Other  negative endocrine ROS  Renal/GU negative Renal ROS  negative genitourinary   Musculoskeletal negative musculoskeletal ROS (+) Arthritis , Osteoarthritis,  Fibromyalgia -  Abdominal   Peds negative pediatric ROS (+)  Hematology negative hematology ROS (+)   Anesthesia Other Findings   Reproductive/Obstetrics negative OB ROS                             Anesthesia Physical Anesthesia Plan  ASA: II  Anesthesia Plan: General   Post-op Pain Management:    Induction: Intravenous  PONV Risk Score and Plan: 2 and Ondansetron and Midazolam  Airway Management Planned: Oral ETT  Additional Equipment:   Intra-op Plan:   Post-operative Plan: Extubation in OR  Informed Consent: I have reviewed the patients History and Physical, chart, labs and discussed the procedure including the risks, benefits and alternatives for the proposed anesthesia with the patient or authorized representative who has indicated his/her understanding and acceptance.   Dental advisory given  Plan Discussed with: CRNA  Anesthesia Plan Comments:         Anesthesia Quick Evaluation

## 2017-02-04 NOTE — H&P (Signed)
Subjective: The patient is a 66 year old white  Female Who has complained of eck and right greater than left arm pain numbness and tingling. She has failed medical management and was worked up with a cervical MRI. This demonstrated disc herniation, spondylosis, stenosis at C4-5, C5-6 and C6-7. I discussed the various treatment optionswith the patient. She has decided to proceed with surgery.  Past Medical History:  Diagnosis Date  . Cataracts, bilateral   . Cervical spondylosis with radiculopathy   . Depression   . Fibromyalgia   . Headache    back in the 90's  . Hypertension   . Spinal stenosis    gets injections in back...last one 01/05/2017    Past Surgical History:  Procedure Laterality Date  . APPENDECTOMY    . cataracts     removed  2003  . EYE SURGERY    . ruptured appendix     when she was 13, ruptured with exploratory lap    Allergies  Allergen Reactions  . Codeine Other (See Comments)    Weird dreams and couldn't sleep    Social History  Substance Use Topics  . Smoking status: Current Every Day Smoker    Packs/day: 1.00    Years: 40.00    Types: Cigarettes  . Smokeless tobacco: Never Used  . Alcohol use 7.2 oz/week    12 Glasses of wine per week    History reviewed. No pertinent family history. Prior to Admission medications   Medication Sig Start Date End Date Taking? Authorizing Provider  ALPRAZolam Prudy Feeler) 0.5 MG tablet Take 0.5 mg by mouth daily as needed for anxiety.   Yes [provider]  Cyanocobalamin (VITAMIN B12) 1000 MCG TBCR Take 1,000 mcg by mouth daily.   Yes [provider]  DULoxetine (CYMBALTA) 60 MG capsule Take 120 mg by mouth daily. 11/01/16  Yes [provider]  hydrochlorothiazide (HYDRODIURIL) 50 MG tablet Take 25 mg by mouth daily. 11/01/16  Yes [provider]  lisinopril (PRINIVIL,ZESTRIL) 10 MG tablet Take 10 mg by mouth daily. 11/01/16  Yes [provider]  predniSONE (DELTASONE) 20 MG tablet  Take 20 mg by mouth every other day. 01/09/17  Yes [provider]     Review of Systems  Positive ROS: as above  All other systems have been reviewed and were otherwise negative with the exception of those mentioned in the HPI and as above.  Objective: Vital signs in last 24 hours: Temp:  [97.9 F (36.6 C)] 97.9 F (36.6 C) (10/24 0544) Pulse Rate:  [87] 87 (10/24 0544) Resp:  [18] 18 (10/24 0544) BP: (144)/(79) 144/79 (10/24 0544) SpO2:  [96 %] 96 % (10/24 0544) Weight:  [89.8 kg (198 lb)] 89.8 kg (198 lb) (10/24 0544)  General Appearance: Alert Head: Normocephalic, without obvious abnormality, atraumatic Eyes: PERRL, conjunctiva/corneas clear, EOM's intact,    Ears: Normal  Throat: Normal  Neck: Supple,positive  Spurling's test Back: unremarkable Lungs: Clear to auscultation bilaterally, respirations unlabored Heart: Regular rate and rhythm, no murmur, rub or gallop Abdomen: Soft, non-tender Extremities: Extremities normal, atraumatic, no cyanosis or edema Skin: unremarkable  NEUROLOGIC:   Mental status: alert and oriented,Motor Exam - grossly normal Sensory Exam - he has numbness in her right greater left arm Reflexes:  Coordination - grossly normal Gait - grossly normal Balance - grossly normal Cranial Nerves: I: smell Not tested  II: visual acuity  OS: Normal  OD: Normal   II: visual fields Full to confrontation  II: pupils Equal,  round, reactive to light  III,VII: ptosis None  III,IV,VI: extraocular muscles  Full ROM  V: mastication Normal  V: facial light touch sensation  Normal  V,VII: corneal reflex  Present  VII: facial muscle function - upper  Normal  VII: facial muscle function - lower Normal  VIII: hearing Not tested  IX: soft palate elevation  Normal  IX,X: gag reflex Present  XI: trapezius strength  5/5  XI: sternocleidomastoid strength 5/5  XI: neck flexion strength  5/5  XII: tongue strength  Normal    Data Review Lab Results   Component Value Date   WBC 9.0 01/29/2017   HGB 15.8 (H) 01/29/2017   HCT 46.6 (H) 01/29/2017   MCV 93.0 01/29/2017   PLT 298 01/29/2017   Lab Results  Component Value Date   NA 136 01/29/2017   K 4.2 01/29/2017   CL 101 01/29/2017   CO2 25 01/29/2017   BUN 10 01/29/2017   CREATININE 0.59 01/29/2017   GLUCOSE 101 (H) 01/29/2017   No results found for: INR, PROTIME  Assessment/Plan: C4-5, C5-6 d C6-7 disc  Degeneration, stenosis,cervicalgia, cervical radiculopathy: I have discussed the situation with the patient. I have reviewed her imaging studies with her.We have discussed the various treatment options including surgery.I have described the surgical treatment option of a C4-5, C5-6 and C6-7 anterior cervical discectomy, fusion, and plating.I have shown her surgical models. I have given her a surgical pamphlet. We have discussed the risks, benefits, alternatives, expected postoperative course, and likelihood of achieving her goals with surgery. I have answered all her questions. She has decided to proceed with surgery.   Aahil Fredin D 02/04/2017 7:25 AM

## 2017-02-04 NOTE — Progress Notes (Signed)
Patient ID: Sherri ChafeSusan Milleson, female   DOB: 02/08/1951, 66 y.o.   MRN: [id number] Subjective:  The patient is alert and pleasant. She looks well. She is in no apparent distress.  Objective: Vital signs in last 24 hours: Temp:  [97.9 F (36.6 C)-98.7 F (37.1 C)] 98.7 F (37.1 C) (10/24 1145) Pulse Rate:  [87-108] 108 (10/24 1145) Resp:  [18] 18 (10/24 1145) BP: (144-156)/(67-79) 156/67 (10/24 1145) SpO2:  [96 %] 96 % (10/24 1145) Weight:  [89.8 kg (198 lb)] 89.8 kg (198 lb) (10/24 0544)  Intake/Output from previous day: No intake/output data recorded. Intake/Output this shift: Total I/O In: 1600 [I.V.:1600] Out: 200 [Blood:200]  Physical exam the patient is alert and pleasant. She is moving all 4 extremities well. There is no deltoid weakness. Her wound is clean and dry. There is no hematoma or shift.  Lab Results: No results for input(s): WBC, HGB, HCT, PLT in the last 72 hours. BMET No results for input(s): NA, K, CL, CO2, GLUCOSE, BUN, CREATININE, CALCIUM in the last 72 hours.  Studies/Results: No results found.  Assessment/Plan: The patient is doing well.  LOS: 0 days     Amer Alcindor D 02/04/2017, 11:51 AM

## 2017-02-05 ENCOUNTER — Encounter (HOSPITAL_COMMUNITY): Payer: Self-pay | Admitting: Neurosurgery

## 2017-02-05 MED ORDER — CYCLOBENZAPRINE HCL 10 MG PO TABS: 10.0000 mg | ORAL_TABLET | Freq: Three times a day (TID) | ORAL | 0 refills | Status: DC | PRN

## 2017-02-05 MED ORDER — OXYCODONE HCL 5 MG PO TABS: 5.0000 mg | ORAL_TABLET | ORAL | 0 refills | Status: DC | PRN

## 2017-02-05 MED ORDER — CYCLOBENZAPRINE HCL 10 MG PO TABS: 10.0000 mg | ORAL_TABLET | Freq: Three times a day (TID) | ORAL | Status: DC | PRN

## 2017-02-05 MED FILL — Thrombin For Soln 5000 Unit: CUTANEOUS | Qty: 5000 | Status: AC

## 2017-02-05 MED FILL — Gelatin Absorbable MT Powder: OROMUCOSAL | Qty: 1 | Status: AC

## 2017-02-05 NOTE — Progress Notes (Signed)
Pt doing well. Pt and daughter given D/C instructions with Rx's, verbal understanding was provided. Pt's incision is clean and dry with no sign of infection. Pt's IV and JP drain were removed prior to D/C. Pt has Aspen collar in place @ D/C. Pt D/C'd home via wheelchair @ [phone] per MD order. Pt is stable @ D/C and has no other needs at this time. Rema FendtAshley Elanie Hammitt, RN

## 2017-02-05 NOTE — Discharge Instructions (Signed)

## 2017-02-06 NOTE — Discharge Summary (Signed)
Physician Discharge Summary  Patient ID: Sherri Stewart MRN: [id number] DOB/AGE: 65/10/1950 65 y.o.  Admit date: 02/04/2017 Discharge date: 02/05/17  Admission Diagnoses: C4-5, C5-6 and C6-7 disc degeneration, spondylosis, stenosis, cervicalgia, cervical radiculopathy  Discharge Diagnoses: The same Active Problems:   Cervical spondylosis with radiculopathy   Discharged Condition: good  Hospital Course: I performed a C4-5, C5-6 and C6-7 anterior cervical discectomy, fusion and plating on the patient on 02/04/2017. The surgery went well.  The patient's postoperative course was unremarkable. On 02/05/2017 she requested discharge to home. She was given written and oral discharge instructions. All her questions were answered.  Consults: None Significant Diagnostic Studies: None Treatments: C4-5, C5-6 and C6-7 anterior cervical discectomy, fusion and plating. Discharge Exam: Blood pressure 118/75, pulse 84, temperature 98.4 F (36.9 C), resp. rate 16, height 5' 0.5" (1.537 m), weight 89.8 kg (198 lb), SpO2 96 %. The patient is alert and pleasant. She looks well. Her strength is grossly normal in all 4 extremities. Her dressing is clean and dry. There is no evidence of hematoma or shift.  Disposition: Home  Discharge Instructions    Call MD for:  difficulty breathing, headache or visual disturbances    Complete by:  As directed    Call MD for:  extreme fatigue    Complete by:  As directed    Call MD for:  hives    Complete by:  As directed    Call MD for:  persistant dizziness or light-headedness    Complete by:  As directed    Call MD for:  persistant nausea and vomiting    Complete by:  As directed    Call MD for:  redness, tenderness, or signs of infection (pain, swelling, redness, odor or green/yellow discharge around incision site)    Complete by:  As directed    Call MD for:  severe uncontrolled pain    Complete by:  As directed    Call MD for:  temperature >100.4     Complete by:  As directed    Diet - low sodium heart healthy    Complete by:  As directed    Discharge instructions    Complete by:  As directed    Call [phone] for a followup appointment. Take a stool softener while you are using pain medications.   Driving Restrictions    Complete by:  As directed    Do not drive for 2 weeks.   Increase activity slowly    Complete by:  As directed    Lifting restrictions    Complete by:  As directed    Do not lift more than 5 pounds. No excessive bending or twisting.   May shower / Bathe    Complete by:  As directed    He may shower after the pain she is removed 3 days after surgery. Leave the incision alone.   Remove dressing in 48 hours    Complete by:  As directed    Your stitches are under the scan and will dissolve by themselves. The Steri-Strips will fall off after you take a few showers. Do not rub back or pick at the wound, Leave the wound alone.     Allergies as of 02/05/2017      Reactions   Codeine Other (See Comments)   Weird dreams and couldn't sleep      Medication List    STOP taking these medications   ALPRAZolam 0.5 MG tablet Commonly known as:  Prudy FeelerXANAX  TAKE these medications   cyclobenzaprine 10 MG tablet Commonly known as:  FLEXERIL Take 1 tablet (10 mg total) by mouth 3 (three) times daily as needed for muscle spasms.   DULoxetine 60 MG capsule Commonly known as:  CYMBALTA Take 120 mg by mouth daily.   hydrochlorothiazide 50 MG tablet Commonly known as:  HYDRODIURIL Take 25 mg by mouth daily.   lisinopril 10 MG tablet Commonly known as:  PRINIVIL,ZESTRIL Take 10 mg by mouth daily.   oxyCODONE 5 MG immediate release tablet Commonly known as:  Oxy IR/ROXICODONE Take 1 tablet (5 mg total) by mouth every 3 (three) hours as needed for moderate pain ((score 4 to 6)).   predniSONE 20 MG tablet Commonly known as:  DELTASONE Take 20 mg by mouth every other day.   Vitamin B12 1000 MCG Tbcr Take 1,000  mcg by mouth daily.      Follow-up Information    Tressie Stalker, MD Follow up in 2 week(s).   Specialty:  Neurosurgery Contact information: [address] [place] [address] [phone]           Signed: Cristi Loron 02/06/2017, 12:48 PM

## 2018-12-19 IMAGING — CR DG CHEST 2V
2 series · 2 of 2 positions shown · non-contrast
Comparison: None.

CLINICAL DATA: Preop for cervical disc fusion.  Hypertension.

EXAM:
CHEST  2 VIEW

[w chest pa]
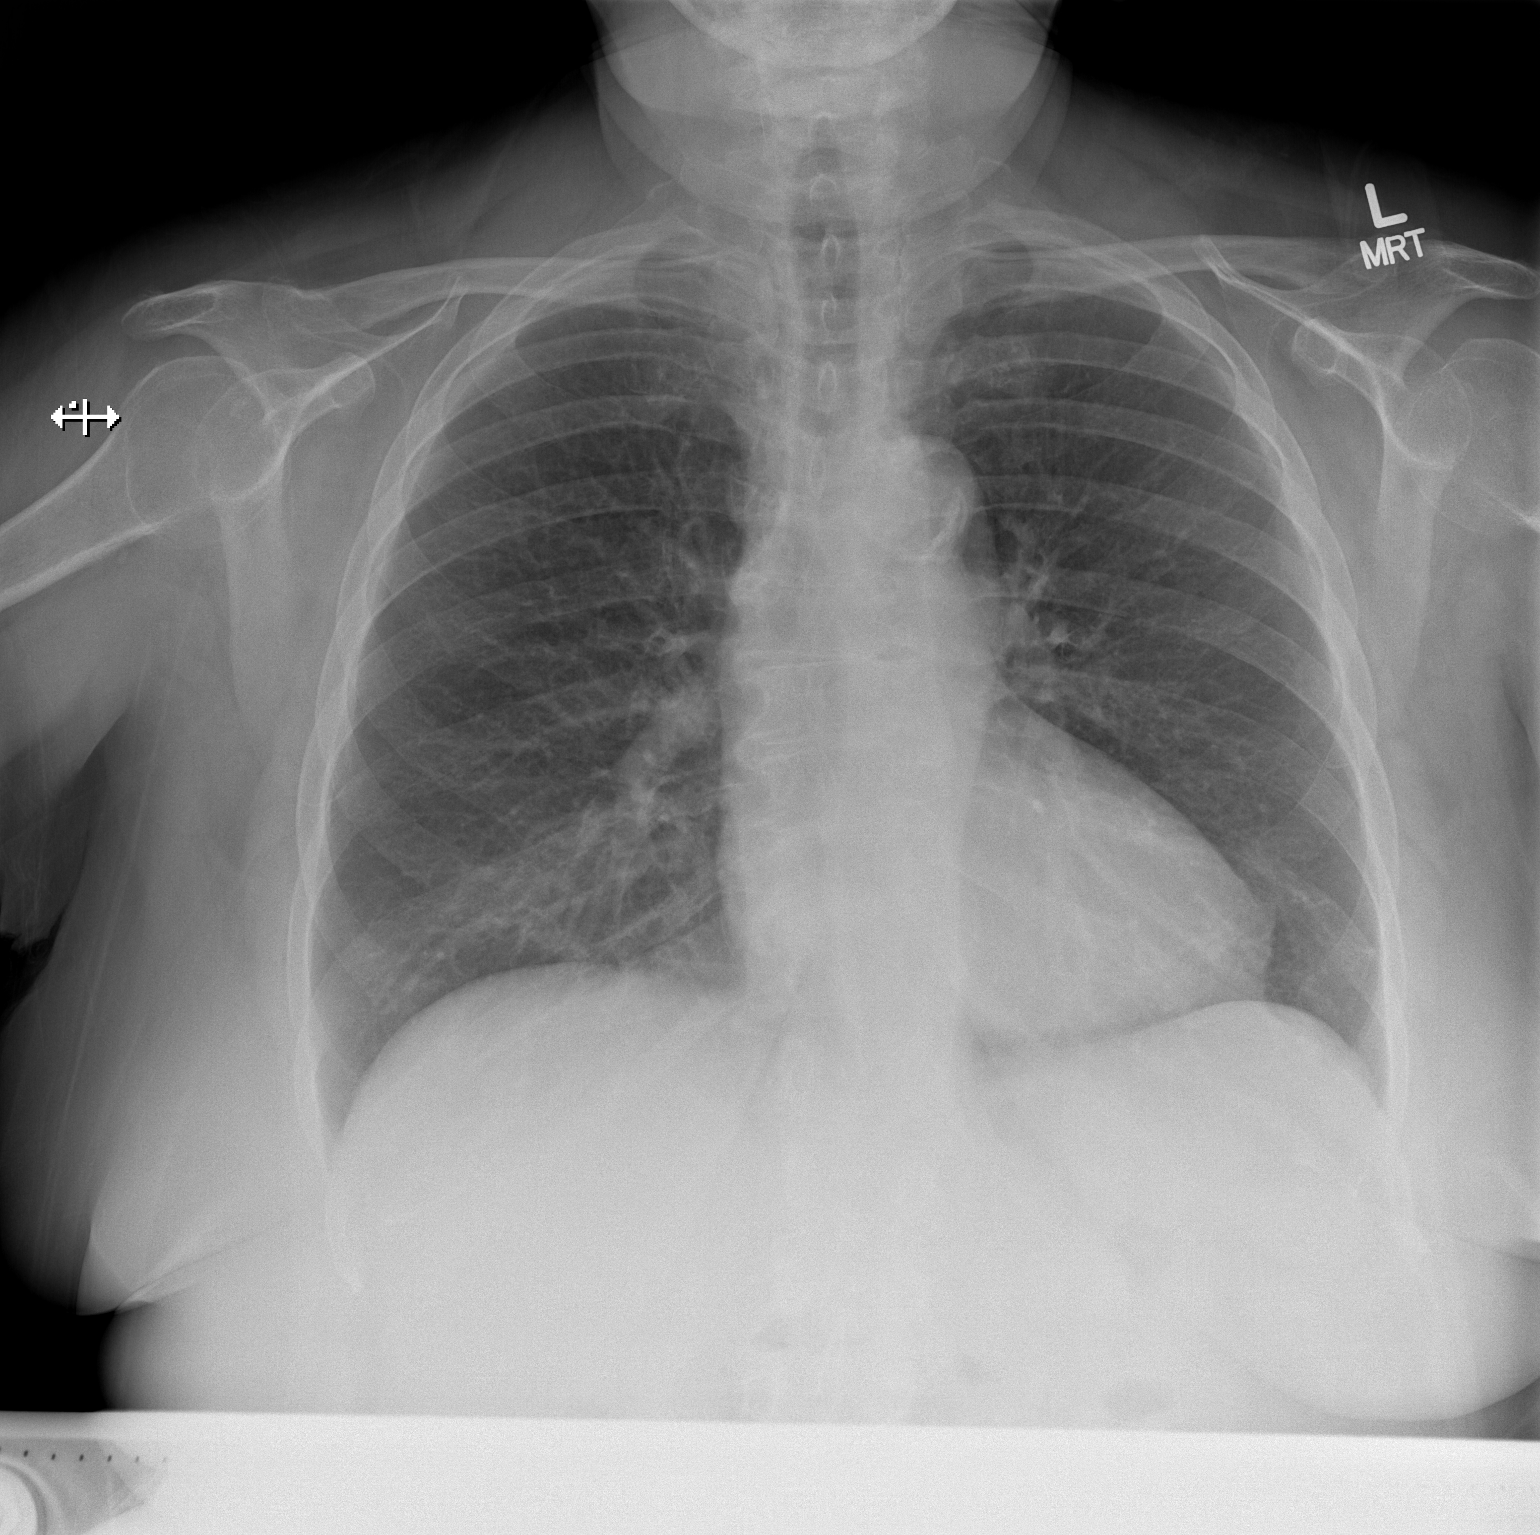

[w chest lat]
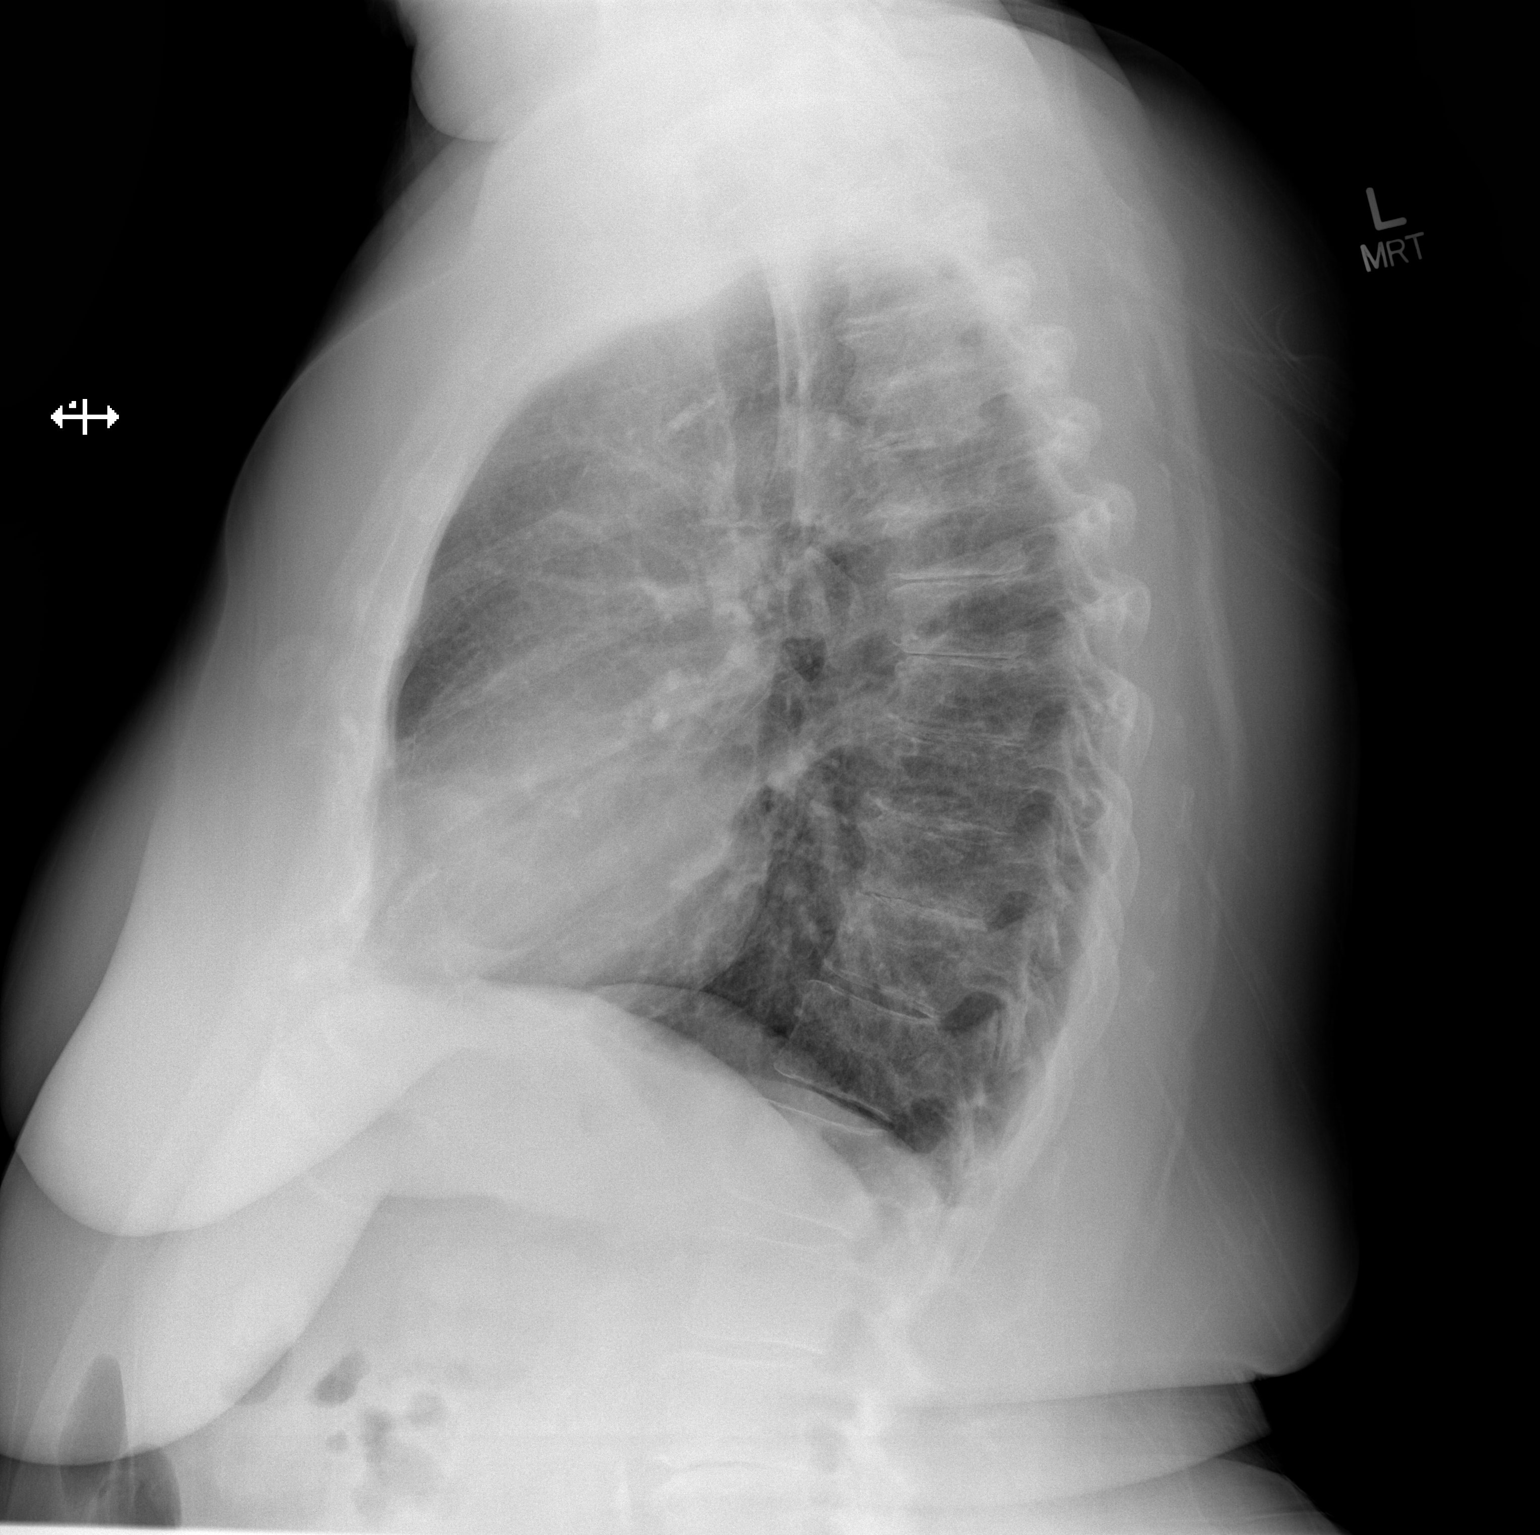

[2 of 2 positions shown; findings below may reference images not displayed]

FINDINGS: The heart size and mediastinal contours are within normal limits.
Both lungs are clear. Atherosclerosis of thoracic aorta is noted. No
pneumothorax or pleural effusion is noted. Multilevel degenerative
disc disease is noted in the thoracic spine.
IMPRESSION: No active cardiopulmonary disease.  Aortic atherosclerosis.

## 2020-04-04 ENCOUNTER — Other Ambulatory Visit: Payer: Self-pay | Admitting: Family Medicine

## 2020-04-04 DIAGNOSIS — R55 Syncope and collapse: Secondary | ICD-10-CM

## 2020-04-11 ENCOUNTER — Other Ambulatory Visit: Payer: Self-pay

## 2020-04-11 ENCOUNTER — Ambulatory Visit: Payer: Medicare Other

## 2020-04-11 DIAGNOSIS — R55 Syncope and collapse: Secondary | ICD-10-CM

## 2020-04-18 NOTE — Progress Notes (Signed)
Patient referred by Leonard Downing, * for syncope  Subjective:   Sherri Stewart, female    DOB: 06-05-1950, 70 y.o.   MRN: [id number]   Chief Complaint  Patient presents with  . Loss of Consciousness  . New Patient (Initial Visit)    HPI  70 y.o. Caucasian female with hypertension, fibromyalgia, tobacco dependence, referred for syncope  Patient lives in [place] and seen by himself. Daughter lives 10 minutes away. Patient has had 3 episodes of syncope over the past 8 months. First episode occurred in May 2021 while standing at home. Second episode occurred in June 2021 while watering her plants in front porch. Third episode occurred in November 2021 while cleaning a furniture shop. At all times, she was standing, she did not have any warning signs such as lightheadedness or presyncope. She denies any chest pain, shortness of breath symptoms. She denies any seizure activity, loss of bladder or bowel tone. On all occasions, there was no collateral history available. She does not know how long she lost consciousness for. On all occasions, she had significant bruising injuries to her head, face. She was not confused after regaining consciousness.  After her episode in November 2021, her PCP Dr. Welton Flakes stopped her lisinopril, suspecting hypertension. Her blood pressure is significantly elevated today. She does not check her blood pressure lately at home.  Patient has been on prednisone 20 mg every other day for several years for fibromyalgia. She admits to not being very regular with timing of her medication intake. She does not rule out possibility of missing a dose here and there.  At baseline, she is fairly active with house projects and working in the yard. She does not do any regular exercise. She does drive herself to grocery stores etc.  Patient is a pack/day smoker for last 40 years. She drinks 2-3 glasses of wine, not every day. She does not remember having had alcohol prior to  her syncopal episodes. She does endorse not having had anything to eat on the morning of her episode in November 2021. Episode occurred around 2 PM. She tells me that this is usual for her not to eat any breakfast or lunch, other than having maybe a cup or 2 of coffee.  Past Medical History:  Diagnosis Date  . Cataracts, bilateral   . Cervical spondylosis with radiculopathy   . Depression   . Fibromyalgia   . Headache    back in the 90's  . Hypertension   . Spinal stenosis    gets injections in back...last one 01/05/2017     Past Surgical History:  Procedure Laterality Date  . ANTERIOR CERVICAL DECOMP/DISCECTOMY FUSION N/A 02/04/2017   Procedure: ANTERIOR CERVICAL DECOMPRESSION/DISCECTOMY FUSION,INTERBODY PROSTHESIS, PLATE/SCREWS CEREVICAL FOUR - CERVICAL FIVE , CERVICAL FIVE - CERVICAL SIX , CERVICAL SIX , CERVICAL SEVEN;  Surgeon: Newman Pies, MD;  Location: [facility];  Service: Neurosurgery;  Laterality: N/A;  . APPENDECTOMY    . cataracts     removed  2003  . EYE SURGERY    . ruptured appendix     when she was 13, ruptured with exploratory lap     Social History   Tobacco Use  Smoking Status Current Every Day Smoker  . Packs/day: 1.00  . Years: 40.00  . Pack years: 40.00  . Types: Cigarettes  Smokeless Tobacco Never Used    Social History   Substance and Sexual Activity  Alcohol Use Yes  . Alcohol/week: 12.0 standard drinks  .  Types: 12 Glasses of wine per week     Family History  Problem Relation Age of Onset  . Hypertension Mother   . Diabetes Mother   . Angelman syndrome Brother      Current Outpatient Medications on File Prior to Visit  Medication Sig Dispense Refill  . ALPRAZolam (XANAX) 0.5 MG tablet Take 0.5 mg by mouth 3 (three) times daily as needed.    . Cyanocobalamin (VITAMIN B12) 1000 MCG TBCR Take 1,000 mcg by mouth daily.    . diazepam (VALIUM) 5 MG tablet SMARTSIG:1 Tablet(s) By Mouth 1 to 2 Times Daily    . DULoxetine (CYMBALTA) 60 MG  capsule Take 120 mg by mouth daily.  1  . hydrochlorothiazide (HYDRODIURIL) 25 MG tablet Take 25 mg by mouth daily.    . predniSONE (DELTASONE) 20 MG tablet Take 20 mg by mouth every other day.  0  . traMADol (ULTRAM) 50 MG tablet SMARTSIG:1 Tablet(s) By Mouth Every 8-12 Hours PRN    . lisinopril (PRINIVIL,ZESTRIL) 10 MG tablet Take 10 mg by mouth daily. (Patient not taking: Reported on 04/19/2020)  2  . oxyCODONE (OXY IR/ROXICODONE) 5 MG immediate release tablet Take 1 tablet (5 mg total) by mouth every 3 (three) hours as needed for moderate pain ((score 4 to 6)). (Patient not taking: Reported on 04/19/2020) 40 tablet 0   No current facility-administered medications on file prior to visit.    Cardiovascular and other pertinent studies:  EKG 04/19/2020: Sinus rhythm 89 bpm  Low voltage in limb leads Biatrial enlargement Cannot exclude old anteroseptal infarct   Echocardiogram 04/11/2020:  Left ventricle cavity is normal in size and wall thickness. Normal global  wall motion. Normal LV systolic function with EF 62%. Indeterminate  diastolic filling pattern due to E/A fusion.  No significant valvular abnormalities.  Normal right atrial pressure.    Recent labs: 03/23/2020: Glucose 100, BUN/Cr 14/0.68. EGFR 90. Na/K 142/5.4. ALT 38. Rest of the CMP normal H/H 16.1/48.3. MCV 93. Platelets 385   Review of Systems  Cardiovascular: Positive for syncope. Negative for chest pain, dyspnea on exertion, leg swelling and palpitations.        Orthostatic VS for the past 72 hrs (Last 3 readings):  Orthostatic BP Patient Position BP Location Cuff Size Orthostatic Pulse  04/19/20 1109 (!) 170/103 Standing Left Arm Normal 105  04/19/20 1108 (!) 163/100 Sitting Left Arm Normal 86  04/19/20 1107 (!) 179/111 Supine Left Arm Normal 88      Vitals:   04/19/20 1108 04/19/20 1109  BP:    Pulse:    SpO2: 94% 94%     Body mass index is 34.37 kg/m. Filed Weights   04/19/20 1056  Weight: 176  lb (79.8 kg)     Objective:   Physical Exam Vitals and nursing note reviewed.  Constitutional:      General: She is not in acute distress. Neck:     Vascular: No JVD.  Cardiovascular:     Rate and Rhythm: Normal rate and regular rhythm.     Heart sounds: Normal heart sounds. No murmur heard.   Pulmonary:     Effort: Pulmonary effort is normal.     Breath sounds: Normal breath sounds. No wheezing or rales.         Assessment & Recommendations:   70 y.o. Caucasian female with hypertension, fibromyalgia, tobacco dependence, referred for syncope  Syncope: 3 episodes since syncope since May 2021, without any warning signs. Normal resting EKG and physical  exam. Orthostatics negative, although heart rate jumps about 20 beats from sitting to standing up. Does not need criteria for POTS. Etiology of her syncope remains unclear. Recent echocardiogram showed structurally normal heart, although diastolic function was indeterminate. I will place her on 2-week light cardiac telemetry. I also recommend remote patient monitoring to evaluate her blood pressure. I will asked her to resume her home medication of lisinopril 10 mg daily. Given her long-term steroid use, adrenal insufficiency is possible. Blood pressure monitoring will help [place] to evaluate for any episodes of hypotension. I will defer work-up for possible adrenal insufficiency to Dr. Arelia Sneddon.  Finally, given her unexplained syncope episodes, I recommend no driving for 6 months after her next episode of syncope-which was in 02/2020.  Further recommendations after above testing.  Thank you for referring the patient to [place]. Please feel free to contact with any questions.   Nigel Mormon, MD Pager: [phone] Office: [phone]

## 2020-04-19 ENCOUNTER — Other Ambulatory Visit: Payer: Self-pay

## 2020-04-19 ENCOUNTER — Inpatient Hospital Stay: Payer: Medicare Other

## 2020-04-19 ENCOUNTER — Ambulatory Visit: Payer: Medicare Other | Admitting: Cardiology

## 2020-04-19 ENCOUNTER — Encounter: Payer: Self-pay | Admitting: Cardiology

## 2020-04-19 VITALS — BP 192/114 | HR 104 | Ht 60.0 in | Wt 176.0 lb

## 2020-04-19 DIAGNOSIS — I10 Essential (primary) hypertension: Secondary | ICD-10-CM | POA: Insufficient documentation

## 2020-04-19 DIAGNOSIS — R55 Syncope and collapse: Secondary | ICD-10-CM

## 2020-05-17 ENCOUNTER — Encounter: Payer: Self-pay | Admitting: Cardiology

## 2020-05-17 ENCOUNTER — Telehealth: Payer: Medicare Other | Admitting: Cardiology

## 2020-05-17 ENCOUNTER — Other Ambulatory Visit: Payer: Self-pay

## 2020-05-17 VITALS — BP 152/104 | HR 101 | Wt 171.0 lb

## 2020-05-17 DIAGNOSIS — I10 Essential (primary) hypertension: Secondary | ICD-10-CM

## 2020-05-17 DIAGNOSIS — R55 Syncope and collapse: Secondary | ICD-10-CM

## 2020-05-17 NOTE — Progress Notes (Signed)
  Patient referred by Elkins, Wilson Oliver, * for syncope  Subjective:   Sherri Stewart, female    DOB: 07/25/1950, 69 y.o.   MRN: [id number]  I connected with the patient on 05/17/2020 by a telephone call and verified that I am speaking with the correct person using two identifiers.     I offered the patient a video enabled application for a virtual visit. Unfortunately, this could not be accomplished due to technical difficulties/lack of video enabled phone/computer. I discussed the limitations of evaluation and management by telemedicine and the availability of in person appointments. The patient expressed understanding and agreed to proceed.   This visit type was conducted due to national recommendations for restrictions regarding the COVID-19 Pandemic (e.g. social distancing).  This format is felt to be most appropriate for this patient at this time.  All issues noted in this document were discussed and addressed.  No physical exam was performed (except for noted visual exam findings with Tele health visits).  The patient has consented to conduct a Tele health visit and understands insurance will be billed.    Chief Complaint  Patient presents with  . Loss of Consciousness    HPI  69 y.o. Caucasian female with hypertension, fibromyalgia, tobacco dependence, referred for syncope  Patient has not had any recurrent syncope, since her last visit with me. Reviewed home BO log. Avg BP 142/94 mmHg.   Patient asked me multiple questions, if any of these could have caused her syncope  i) If I lift >20 lbs, could that make me pass out Ii) My skin is very thin and I bruise easily. Could that make me pass out? Iii) I sometimes feel very hot, but did not feel that way before passing out. Could this be related?   Initial consultation HPI 04/19/2020: Patient lives in [place] and seen by himself. Daughter lives 10 minutes away. Patient has had 3 episodes of syncope over the past 8 months. First  episode occurred in May 2021 while standing at home. Second episode occurred in June 2021 while watering her plants in front porch. Third episode occurred in November 2021 while cleaning a furniture shop. At all times, she was standing, she did not have any warning signs such as lightheadedness or presyncope. She denies any chest pain, shortness of breath symptoms. She denies any seizure activity, loss of bladder or bowel tone. On all occasions, there was no collateral history available. She does not know how long she lost consciousness for. On all occasions, she had significant bruising injuries to her head, face. She was not confused after regaining consciousness.  After her episode in November 2021, her PCP Dr. Elkin stopped her lisinopril, suspecting hypertension. Her blood pressure is significantly elevated today. She does not check her blood pressure lately at home.  Patient has been on prednisone 20 mg every other day for several years for fibromyalgia. She admits to not being very regular with timing of her medication intake. She does not rule out possibility of missing a dose here and there.  At baseline, she is fairly active with house projects and working in the yard. She does not do any regular exercise. She does drive herself to grocery stores etc.  Patient is a pack/day smoker for last 40 years. She drinks 2-3 glasses of wine, not every day. She does not remember having had alcohol prior to her syncopal episodes. She does endorse not having had anything to eat on the morning of her episode in November   2021. Episode occurred around 2 PM. She tells me that this is usual for her not to eat any breakfast or lunch, other than having maybe a cup or 2 of coffee.   Current Outpatient Medications on File Prior to Visit  Medication Sig Dispense Refill  . ALPRAZolam (XANAX) 0.5 MG tablet Take 0.5 mg by mouth 3 (three) times daily as needed.    . Cyanocobalamin (VITAMIN B12) 1000 MCG TBCR Take 1,000  mcg by mouth daily.    . diazepam (VALIUM) 5 MG tablet SMARTSIG:1 Tablet(s) By Mouth 1 to 2 Times Daily    . DULoxetine (CYMBALTA) 60 MG capsule Take 120 mg by mouth daily.  1  . hydrochlorothiazide (HYDRODIURIL) 25 MG tablet Take 25 mg by mouth daily.    . lisinopril (PRINIVIL,ZESTRIL) 10 MG tablet Take 10 mg by mouth daily. (Patient not taking: Reported on 04/19/2020)  2  . predniSONE (DELTASONE) 20 MG tablet Take 20 mg by mouth every other day.  0  . traMADol (ULTRAM) 50 MG tablet SMARTSIG:1 Tablet(s) By Mouth Every 8-12 Hours PRN     No current facility-administered medications on file prior to visit.    Cardiovascular and other pertinent studies:  Mobile cardiac telemetry 13 days 04/19/2020 - 05/03/2020: Dominant rhythm: Sinus. HR 60-159 bpm. Avg HR 96 bpm. 5 episodes of probable atrial tachycardia, fastest at 169 bpm for 4 beats, longest for 5 beats at 107 bpm. <1% isolated SVE, couplet/triplets. <1% isolated VE, couplet No atrial fibrillation/atrial flutter/VT/high grade AV block, sinus pause >3sec noted. 3 patient triggered events, correlated with artifact, ventricular ectopy  EKG 04/19/2020: Sinus rhythm 89 bpm  Low voltage in limb leads Biatrial enlargement Cannot exclude old anteroseptal infarct   Echocardiogram 04/11/2020:  Left ventricle cavity is normal in size and wall thickness. Normal global  wall motion. Normal LV systolic function with EF 62%. Indeterminate  diastolic filling pattern due to E/A fusion.  No significant valvular abnormalities.  Normal right atrial pressure.    Recent labs: 03/23/2020: Glucose 100, BUN/Cr 14/0.68. EGFR 90. Na/K 142/5.4. ALT 38. Rest of the CMP normal H/H 16.1/48.3. MCV 93. Platelets 385   Review of Systems  Cardiovascular: Positive for syncope. Negative for chest pain, dyspnea on exertion, leg swelling and palpitations.       Vitals:   05/17/20 1243  BP: (!) 152/104  Pulse: (!) 101     Body mass index is 33.4  kg/m. Filed Weights   05/17/20 1243  Weight: 171 lb (77.6 kg)     Objective:   Physical Exam Not performed. Telephone visit     Assessment & Recommendations:   69 y.o. Caucasian female with hypertension, fibromyalgia, tobacco dependence, referred for syncope  Syncope: 3 episodes since syncope since May 2021, without any warning signs. Normal resting EKG and physical exam. No malignant arrhthymias on cardiac telemetry Orthostatics negative, although heart rate jumps about 20 beats from sitting to standing up. Does not need criteria for POTS. Etiology of her syncope remains unclear. Recent echocardiogram showed structurally normal heart, although diastolic function was indeterminate. Given her long-term steroid use, adrenal insufficiency is possible. Blood pressure monitoring will help us to evaluate for any episodes of hypotension. I will defer work-up for possible adrenal insufficiency to Dr. Elkins. I do not think any of patient's questions above could explain why she had syncope. Vasovagal syncope remains a possibility, but no clear warning symptoms prior to her syncopal episodes.  I discussed with the patient regarding reducing HCTZ dose, perhaps increasing   lisinopril as needed. Also, could consider checking TSH given her intolerance to heat. Finally, we could consider loop recorder placement given her recurrent syncope, without any definitve cause. I have again asked her to avoid driving for another 3 months (last syncope in 02/2020)  I will see her back in May 2022. We will discuss the need for loop recorder again, at that time.    Manish J Patwardhan, MD Pager: [phone] Office: [phone] 

## 2021-06-12 DEATH — deceased

## 2021-10-30 ENCOUNTER — Telehealth: Payer: Medicare Other | Admitting: Physician Assistant

## 2021-10-30 NOTE — Telephone Encounter (Signed)
I left message to call office to discuss medication.

## 2021-10-30 NOTE — Telephone Encounter (Signed)
Patient's spouse called and said the patient has been having trouble taking the donepezil.   She stopped it due to increasing it was not tolerable at more than .5mg .  He will start her back on .5mg  tonight but wanted , PA to be aware.
